# Patient Record
Sex: Female | Born: 1935 | Race: Black or African American | Hispanic: No | Marital: Married | Smoking: Former smoker
Health system: Southern US, Community
[De-identification: ages and names within clinical notes are randomized; demographics above are authoritative.]

## PROBLEM LIST (undated history)

## (undated) DIAGNOSIS — C801 Malignant (primary) neoplasm, unspecified: Secondary | ICD-10-CM

## (undated) DIAGNOSIS — I10 Essential (primary) hypertension: Secondary | ICD-10-CM

## (undated) DIAGNOSIS — E785 Hyperlipidemia, unspecified: Secondary | ICD-10-CM

## (undated) HISTORY — DX: Essential (primary) hypertension: I10

## (undated) HISTORY — DX: Malignant (primary) neoplasm, unspecified: C80.1

## (undated) HISTORY — DX: Hyperlipidemia, unspecified: E78.5

## (undated) HISTORY — PX: BREAST LUMPECTOMY: SHX2

---

## 1998-01-29 ENCOUNTER — Ambulatory Visit (HOSPITAL_COMMUNITY): Admission: RE | Admit: 1998-01-29 | Discharge: 1998-01-29 | Payer: Self-pay | Admitting: *Deleted

## 1998-08-26 ENCOUNTER — Encounter: Admission: RE | Admit: 1998-08-26 | Discharge: 1998-11-24 | Payer: Self-pay | Admitting: Unknown Physician Specialty

## 1998-09-15 ENCOUNTER — Emergency Department (HOSPITAL_COMMUNITY): Admission: EM | Admit: 1998-09-15 | Discharge: 1998-09-15 | Payer: Self-pay | Admitting: Emergency Medicine

## 1998-09-15 ENCOUNTER — Encounter: Payer: Self-pay | Admitting: Emergency Medicine

## 2003-12-31 ENCOUNTER — Encounter: Admission: RE | Admit: 2003-12-31 | Discharge: 2003-12-31 | Payer: Self-pay | Admitting: General Surgery

## 2003-12-31 ENCOUNTER — Encounter (INDEPENDENT_AMBULATORY_CARE_PROVIDER_SITE_OTHER): Payer: Self-pay | Admitting: Specialist

## 2004-01-18 ENCOUNTER — Encounter (HOSPITAL_COMMUNITY): Admission: RE | Admit: 2004-01-18 | Discharge: 2004-04-17 | Payer: Self-pay | Admitting: General Surgery

## 2004-02-04 ENCOUNTER — Encounter (INDEPENDENT_AMBULATORY_CARE_PROVIDER_SITE_OTHER): Payer: Self-pay | Admitting: *Deleted

## 2004-02-04 ENCOUNTER — Ambulatory Visit (HOSPITAL_COMMUNITY): Admission: RE | Admit: 2004-02-04 | Discharge: 2004-02-04 | Payer: Self-pay | Admitting: General Surgery

## 2004-02-04 ENCOUNTER — Ambulatory Visit (HOSPITAL_BASED_OUTPATIENT_CLINIC_OR_DEPARTMENT_OTHER): Admission: RE | Admit: 2004-02-04 | Discharge: 2004-02-04 | Payer: Self-pay | Admitting: General Surgery

## 2004-02-04 ENCOUNTER — Encounter: Admission: RE | Admit: 2004-02-04 | Discharge: 2004-02-04 | Payer: Self-pay | Admitting: General Surgery

## 2004-03-18 ENCOUNTER — Ambulatory Visit: Admission: RE | Admit: 2004-03-18 | Discharge: 2004-05-22 | Payer: Self-pay | Admitting: Radiation Oncology

## 2004-06-17 ENCOUNTER — Ambulatory Visit: Admission: RE | Admit: 2004-06-17 | Discharge: 2004-06-17 | Payer: Self-pay | Admitting: Radiation Oncology

## 2004-08-22 ENCOUNTER — Ambulatory Visit: Payer: Self-pay | Admitting: Oncology

## 2004-09-12 ENCOUNTER — Encounter: Admission: RE | Admit: 2004-09-12 | Discharge: 2004-09-12 | Payer: Self-pay | Admitting: Oncology

## 2004-10-29 ENCOUNTER — Ambulatory Visit: Payer: Self-pay | Admitting: Oncology

## 2004-11-19 ENCOUNTER — Ambulatory Visit: Admission: RE | Admit: 2004-11-19 | Discharge: 2004-11-19 | Payer: Self-pay | Admitting: Radiation Oncology

## 2005-04-29 ENCOUNTER — Ambulatory Visit: Payer: Self-pay | Admitting: Oncology

## 2005-09-22 ENCOUNTER — Encounter (INDEPENDENT_AMBULATORY_CARE_PROVIDER_SITE_OTHER): Payer: Self-pay | Admitting: Specialist

## 2005-09-22 ENCOUNTER — Encounter: Admission: RE | Admit: 2005-09-22 | Discharge: 2005-09-22 | Payer: Self-pay | Admitting: Oncology

## 2005-10-28 ENCOUNTER — Ambulatory Visit: Payer: Self-pay | Admitting: Oncology

## 2006-03-23 ENCOUNTER — Encounter: Admission: RE | Admit: 2006-03-23 | Discharge: 2006-03-23 | Payer: Self-pay | Admitting: Oncology

## 2006-04-20 ENCOUNTER — Ambulatory Visit: Payer: Self-pay | Admitting: Oncology

## 2006-04-22 LAB — CBC WITH DIFFERENTIAL/PLATELET
BASO%: 0.8 % (ref 0.0–2.0)
Eosinophils Absolute: 0.1 10*3/uL (ref 0.0–0.5)
LYMPH%: 25.6 % (ref 14.0–48.0)
MCHC: 34 g/dL (ref 32.0–36.0)
MONO#: 0.7 10*3/uL (ref 0.1–0.9)
NEUT#: 3.9 10*3/uL (ref 1.5–6.5)
Platelets: 271 10*3/uL (ref 145–400)
RBC: 4.52 10*6/uL (ref 3.70–5.32)
RDW: 13.8 % (ref 11.3–14.5)
WBC: 6.4 10*3/uL (ref 3.9–10.0)
lymph#: 1.6 10*3/uL (ref 0.9–3.3)

## 2006-04-22 LAB — COMPREHENSIVE METABOLIC PANEL
ALT: 17 U/L (ref 0–40)
Albumin: 4.2 g/dL (ref 3.5–5.2)
CO2: 24 mEq/L (ref 19–32)
Chloride: 101 mEq/L (ref 96–112)
Glucose, Bld: 179 mg/dL — ABNORMAL HIGH (ref 70–99)
Potassium: 3.8 mEq/L (ref 3.5–5.3)
Sodium: 138 mEq/L (ref 135–145)
Total Bilirubin: 0.6 mg/dL (ref 0.3–1.2)
Total Protein: 6.8 g/dL (ref 6.0–8.3)

## 2006-04-22 LAB — LACTATE DEHYDROGENASE: LDH: 119 U/L (ref 94–250)

## 2006-09-24 ENCOUNTER — Encounter: Admission: RE | Admit: 2006-09-24 | Discharge: 2006-09-24 | Payer: Self-pay | Admitting: Oncology

## 2006-10-19 ENCOUNTER — Ambulatory Visit: Payer: Self-pay | Admitting: Oncology

## 2006-10-21 LAB — CBC WITH DIFFERENTIAL/PLATELET
BASO%: 0.6 % (ref 0.0–2.0)
LYMPH%: 18.7 % (ref 14.0–48.0)
MCHC: 34.1 g/dL (ref 32.0–36.0)
MONO#: 0.6 10*3/uL (ref 0.1–0.9)
Platelets: 295 10*3/uL (ref 145–400)
RBC: 4.88 10*6/uL (ref 3.70–5.32)
RDW: 13.2 % (ref 11.3–14.5)
WBC: 6.6 10*3/uL (ref 3.9–10.0)

## 2006-10-21 LAB — LACTATE DEHYDROGENASE: LDH: 132 U/L (ref 94–250)

## 2006-10-21 LAB — COMPREHENSIVE METABOLIC PANEL
ALT: 9 U/L (ref 0–35)
Alkaline Phosphatase: 57 U/L (ref 39–117)
CO2: 27 mEq/L (ref 19–32)
Potassium: 4 mEq/L (ref 3.5–5.3)
Sodium: 138 mEq/L (ref 135–145)
Total Bilirubin: 0.5 mg/dL (ref 0.3–1.2)
Total Protein: 7 g/dL (ref 6.0–8.3)

## 2006-10-22 ENCOUNTER — Encounter: Admission: RE | Admit: 2006-10-22 | Discharge: 2006-10-22 | Payer: Self-pay | Admitting: Oncology

## 2007-05-03 ENCOUNTER — Ambulatory Visit: Payer: Self-pay | Admitting: Oncology

## 2007-05-05 LAB — COMPREHENSIVE METABOLIC PANEL
ALT: 13 U/L (ref 0–35)
AST: 14 U/L (ref 0–37)
Albumin: 4.5 g/dL (ref 3.5–5.2)
Alkaline Phosphatase: 75 U/L (ref 39–117)
BUN: 15 mg/dL (ref 6–23)
Calcium: 9.9 mg/dL (ref 8.4–10.5)
Chloride: 101 mEq/L (ref 96–112)
Potassium: 4.1 mEq/L (ref 3.5–5.3)
Sodium: 138 mEq/L (ref 135–145)

## 2007-05-05 LAB — CBC WITH DIFFERENTIAL/PLATELET
BASO%: 0.6 % (ref 0.0–2.0)
EOS%: 3.4 % (ref 0.0–7.0)
HCT: 42.6 % (ref 34.8–46.6)
LYMPH%: 26.8 % (ref 14.0–48.0)
MCH: 29.2 pg (ref 26.0–34.0)
MCHC: 34 g/dL (ref 32.0–36.0)
MONO%: 11.5 % (ref 0.0–13.0)
NEUT%: 57.7 % (ref 39.6–76.8)
Platelets: 289 10*3/uL (ref 145–400)

## 2007-10-24 ENCOUNTER — Ambulatory Visit: Payer: Self-pay | Admitting: Oncology

## 2007-10-24 ENCOUNTER — Encounter: Admission: RE | Admit: 2007-10-24 | Discharge: 2007-10-24 | Payer: Self-pay | Admitting: Oncology

## 2007-10-26 LAB — CBC WITH DIFFERENTIAL/PLATELET
Basophils Absolute: 0.1 10*3/uL (ref 0.0–0.1)
EOS%: 2 % (ref 0.0–7.0)
Eosinophils Absolute: 0.1 10*3/uL (ref 0.0–0.5)
HCT: 42.2 % (ref 34.8–46.6)
HGB: 14.3 g/dL (ref 11.6–15.9)
MCH: 29.8 pg (ref 26.0–34.0)
MCV: 87.6 fL (ref 81.0–101.0)
MONO%: 10.4 % (ref 0.0–13.0)
NEUT#: 4.5 10*3/uL (ref 1.5–6.5)
NEUT%: 61.4 % (ref 39.6–76.8)
lymph#: 1.9 10*3/uL (ref 0.9–3.3)

## 2007-10-27 LAB — COMPREHENSIVE METABOLIC PANEL
AST: 14 U/L (ref 0–37)
Albumin: 4.3 g/dL (ref 3.5–5.2)
BUN: 18 mg/dL (ref 6–23)
Calcium: 9.9 mg/dL (ref 8.4–10.5)
Chloride: 100 mEq/L (ref 96–112)
Creatinine, Ser: 1.04 mg/dL (ref 0.40–1.20)
Glucose, Bld: 171 mg/dL — ABNORMAL HIGH (ref 70–99)
Potassium: 4.5 mEq/L (ref 3.5–5.3)

## 2007-11-02 LAB — VITAMIN D 1,25 DIHYDROXY: Vit D, 1,25-Dihydroxy: 34 pg/mL (ref 6–62)

## 2008-04-23 ENCOUNTER — Ambulatory Visit: Payer: Self-pay | Admitting: Oncology

## 2008-06-05 LAB — CBC WITH DIFFERENTIAL/PLATELET
BASO%: 0.6 % (ref 0.0–2.0)
LYMPH%: 29.8 % (ref 14.0–48.0)
MCHC: 33.2 g/dL (ref 32.0–36.0)
MCV: 90.4 fL (ref 81.0–101.0)
MONO#: 0.6 10*3/uL (ref 0.1–0.9)
MONO%: 9.7 % (ref 0.0–13.0)
Platelets: 250 10*3/uL (ref 145–400)
RBC: 4.6 10*6/uL (ref 3.70–5.32)
RDW: 13 % (ref 11.3–14.5)
WBC: 6.7 10*3/uL (ref 3.9–10.0)

## 2008-06-06 LAB — COMPREHENSIVE METABOLIC PANEL
ALT: 13 U/L (ref 0–35)
AST: 16 U/L (ref 0–37)
Creatinine, Ser: 0.89 mg/dL (ref 0.40–1.20)
Sodium: 138 mEq/L (ref 135–145)
Total Bilirubin: 0.6 mg/dL (ref 0.3–1.2)

## 2008-06-06 LAB — CANCER ANTIGEN 27.29: CA 27.29: 31 U/mL (ref 0–39)

## 2008-10-24 ENCOUNTER — Encounter: Admission: RE | Admit: 2008-10-24 | Discharge: 2008-10-24 | Payer: Self-pay | Admitting: Oncology

## 2008-11-15 ENCOUNTER — Ambulatory Visit: Payer: Self-pay | Admitting: Oncology

## 2008-11-19 LAB — CBC WITH DIFFERENTIAL/PLATELET
Basophils Absolute: 0.1 10*3/uL (ref 0.0–0.1)
EOS%: 1.7 % (ref 0.0–7.0)
Eosinophils Absolute: 0.1 10*3/uL (ref 0.0–0.5)
HGB: 13.9 g/dL (ref 11.6–15.9)
LYMPH%: 20.4 % (ref 14.0–49.7)
MCH: 29.9 pg (ref 25.1–34.0)
MCV: 88.8 fL (ref 79.5–101.0)
MONO%: 8.8 % (ref 0.0–14.0)
Platelets: 256 10*3/uL (ref 145–400)
RDW: 13.9 % (ref 11.2–14.5)

## 2008-11-20 LAB — COMPREHENSIVE METABOLIC PANEL
AST: 13 U/L (ref 0–37)
Albumin: 4.2 g/dL (ref 3.5–5.2)
Alkaline Phosphatase: 60 U/L (ref 39–117)
BUN: 17 mg/dL (ref 6–23)
Creatinine, Ser: 0.89 mg/dL (ref 0.40–1.20)
Glucose, Bld: 232 mg/dL — ABNORMAL HIGH (ref 70–99)
Potassium: 4.2 mEq/L (ref 3.5–5.3)
Total Bilirubin: 0.4 mg/dL (ref 0.3–1.2)

## 2008-11-20 LAB — VITAMIN D 25 HYDROXY (VIT D DEFICIENCY, FRACTURES): Vit D, 25-Hydroxy: 73 ng/mL (ref 30–89)

## 2009-05-17 ENCOUNTER — Ambulatory Visit: Payer: Self-pay | Admitting: Oncology

## 2009-05-21 LAB — CBC WITH DIFFERENTIAL/PLATELET
BASO%: 0.9 % (ref 0.0–2.0)
Basophils Absolute: 0.1 10*3/uL (ref 0.0–0.1)
EOS%: 2.9 % (ref 0.0–7.0)
LYMPH%: 26.2 % (ref 14.0–49.7)
MONO#: 0.5 10*3/uL (ref 0.1–0.9)
NEUT%: 62.5 % (ref 38.4–76.8)
RBC: 4.96 10*6/uL (ref 3.70–5.45)
RDW: 13.3 % (ref 11.2–14.5)
lymph#: 1.8 10*3/uL (ref 0.9–3.3)
nRBC: 0 % (ref 0–0)

## 2009-05-22 LAB — CANCER ANTIGEN 27.29: CA 27.29: 32 U/mL (ref 0–39)

## 2009-05-22 LAB — COMPREHENSIVE METABOLIC PANEL
ALT: 17 U/L (ref 0–35)
BUN: 17 mg/dL (ref 6–23)
Chloride: 101 mEq/L (ref 96–112)
Creatinine, Ser: 0.97 mg/dL (ref 0.40–1.20)
Sodium: 136 mEq/L (ref 135–145)

## 2009-10-25 ENCOUNTER — Encounter: Admission: RE | Admit: 2009-10-25 | Discharge: 2009-10-25 | Payer: Self-pay | Admitting: Oncology

## 2010-05-08 ENCOUNTER — Ambulatory Visit: Payer: Self-pay | Admitting: Oncology

## 2010-05-12 LAB — CBC WITH DIFFERENTIAL/PLATELET
EOS%: 2.7 % (ref 0.0–7.0)
Eosinophils Absolute: 0.2 10*3/uL (ref 0.0–0.5)
HCT: 42.3 % (ref 34.8–46.6)
HGB: 14.4 g/dL (ref 11.6–15.9)
MCH: 30.4 pg (ref 25.1–34.0)
MCV: 89.2 fL (ref 79.5–101.0)
RBC: 4.74 10*6/uL (ref 3.70–5.45)
RDW: 13.7 % (ref 11.2–14.5)
WBC: 7.7 10*3/uL (ref 3.9–10.3)

## 2010-05-13 LAB — COMPREHENSIVE METABOLIC PANEL
ALT: 13 U/L (ref 0–35)
Albumin: 4.6 g/dL (ref 3.5–5.2)
CO2: 27 mEq/L (ref 19–32)
Chloride: 101 mEq/L (ref 96–112)
Creatinine, Ser: 1.06 mg/dL (ref 0.40–1.20)
Sodium: 140 mEq/L (ref 135–145)
Total Protein: 7.3 g/dL (ref 6.0–8.3)

## 2010-05-13 LAB — CANCER ANTIGEN 27.29: CA 27.29: 28 U/mL (ref 0–39)

## 2010-05-13 LAB — LACTATE DEHYDROGENASE: LDH: 126 U/L (ref 94–250)

## 2010-08-30 ENCOUNTER — Other Ambulatory Visit: Payer: Self-pay | Admitting: Oncology

## 2010-08-30 DIAGNOSIS — N63 Unspecified lump in unspecified breast: Secondary | ICD-10-CM

## 2010-08-30 DIAGNOSIS — Z780 Asymptomatic menopausal state: Secondary | ICD-10-CM

## 2010-10-27 ENCOUNTER — Ambulatory Visit
Admission: RE | Admit: 2010-10-27 | Discharge: 2010-10-27 | Disposition: A | Discharge status: Home / Self Care | Payer: Medicare Other | Source: Ambulatory Visit | Attending: Oncology | Admitting: Oncology

## 2010-10-27 ENCOUNTER — Ambulatory Visit
Admission: RE | Admit: 2010-10-27 | Discharge: 2010-10-27 | Disposition: A | Discharge status: Home / Self Care | Payer: PRIVATE HEALTH INSURANCE | Source: Ambulatory Visit | Attending: Oncology | Admitting: Oncology

## 2010-10-27 DIAGNOSIS — N63 Unspecified lump in unspecified breast: Secondary | ICD-10-CM

## 2010-10-27 DIAGNOSIS — Z780 Asymptomatic menopausal state: Secondary | ICD-10-CM

## 2010-11-27 ENCOUNTER — Other Ambulatory Visit: Payer: Self-pay | Admitting: Dermatology

## 2010-12-26 NOTE — Op Note (Signed)
Aimee Kim, Aimee Kim                           ACCOUNT NO.:  [id number]   MEDICAL RECORD NO.:  [id number]                   PATIENT TYPE:  AMB   LOCATION:  DSC                                  FACILITY:  [facility]   PHYSICIAN:  Gabrielle Dare. Janee Morn, M.D.             DATE OF BIRTH:  [birth year]   DATE OF PROCEDURE:  02/04/2004  DATE OF DISCHARGE:                                 OPERATIVE REPORT   PREOPERATIVE DIAGNOSIS:  Ductal carcinoma in situ, right breast.   POSTOPERATIVE DIAGNOSIS:  Ductal carcinoma in situ, right breast.   OPERATION PERFORMED:  Right partial mastectomy with needle localization.   SURGEON:  Gabrielle Dare. Janee Morn, M.D.   ASSISTANT:  Rose Phi. Maple Hudson, M.D.   ANESTHESIA:  MAC.   INDICATIONS FOR PROCEDURE:  The patient is a 75 year old African-American  female whom I evaluated in the office for suspicious mammogram findings in  the upper outer quadrant of her right breast.  She was sent for biopsy at  the breast center which demonstrated DCIS and she presents today for needle  localization biopsy of right breast.   DESCRIPTION OF PROCEDURE:  The patient had insertion of localization needles  at the breast center and their report requests tissue encompassing both the  needles and down to the patient's nipple.  The films were reviewed.  The  patient was brought to the operating room after informed consent was  obtained.  She received intravenous antibiotics.  MAC anesthesia was  administered.  Her right breast was prepped and draped in sterile fashion.  A curvilinear several centimeter incision was made between the two needles.  Subcutaneous tissues were dissected down.  A flap was raised superiorly and  we brought this back until we encountered the needle which was brought into  the specimen on the far aspect of that.  We then continued our dissection of  the mass down to the chest wall and continued the dissection caudally.  Subsequently, we created a flap along the lower  edge of the incision until  we reached the needle which was brought into the wound.  Then we continued  our dissection to make the specimen encompass tissue all the way up to the  nipple as requested and then we dissected down to the chest wall and the  mass was taken off of the chest wall.  This was all done with Bovie cautery  with good hemostasis obtained along the way.  The mass was marked for  orientation with the margin marking system and sent for confirmatory  mammogram.  The wound was copiously irrigated.  Meticulous hemostasis was  obtained.  Subcutaneous tissues were reapproximated with a series of  interrupted 3-0 Vicryl sutures and the skin was closed with a running 4-0  Monocryl subcuticular stitch.  Sponge, needle and instrument counts were  correct.  Benzoin, Steri-Strips and sterile dressings were applied.  We did  a report that  the specimen was included from radiology, so all things looked  good.  The patient tolerated the procedure well without apparent  complication and was taken to the recovery room in stable condition.                                               Gabrielle Dare Janee Morn, M.D.   BET/MEDQ  D:  02/04/2004  T:  02/04/2004  Job:  [id number]

## 2011-05-21 ENCOUNTER — Other Ambulatory Visit: Payer: Self-pay | Admitting: Oncology

## 2011-05-21 ENCOUNTER — Encounter (HOSPITAL_BASED_OUTPATIENT_CLINIC_OR_DEPARTMENT_OTHER): Payer: Medicare Other | Admitting: Oncology

## 2011-05-21 DIAGNOSIS — C50419 Malignant neoplasm of upper-outer quadrant of unspecified female breast: Secondary | ICD-10-CM

## 2011-05-21 DIAGNOSIS — E119 Type 2 diabetes mellitus without complications: Secondary | ICD-10-CM

## 2011-05-21 DIAGNOSIS — I10 Essential (primary) hypertension: Secondary | ICD-10-CM

## 2011-05-21 DIAGNOSIS — E7800 Pure hypercholesterolemia, unspecified: Secondary | ICD-10-CM

## 2011-05-21 LAB — CBC WITH DIFFERENTIAL/PLATELET
Basophils Absolute: 0 10*3/uL (ref 0.0–0.1)
EOS%: 5.3 % (ref 0.0–7.0)
Eosinophils Absolute: 0.4 10*3/uL (ref 0.0–0.5)
LYMPH%: 22.8 % (ref 14.0–49.7)
MCH: 29.5 pg (ref 25.1–34.0)
MCV: 89.1 fL (ref 79.5–101.0)
MONO%: 8 % (ref 0.0–14.0)
NEUT#: 4.9 10*3/uL (ref 1.5–6.5)
Platelets: 253 10*3/uL (ref 145–400)
RBC: 4.88 10*6/uL (ref 3.70–5.45)

## 2011-05-22 LAB — COMPREHENSIVE METABOLIC PANEL
AST: 14 U/L (ref 0–37)
Alkaline Phosphatase: 55 U/L (ref 39–117)
BUN: 24 mg/dL — ABNORMAL HIGH (ref 6–23)
Glucose, Bld: 155 mg/dL — ABNORMAL HIGH (ref 70–99)
Total Bilirubin: 0.4 mg/dL (ref 0.3–1.2)

## 2011-05-28 ENCOUNTER — Other Ambulatory Visit: Payer: Self-pay | Admitting: Oncology

## 2011-05-28 ENCOUNTER — Encounter (HOSPITAL_BASED_OUTPATIENT_CLINIC_OR_DEPARTMENT_OTHER): Payer: Medicare Other | Admitting: Oncology

## 2011-05-28 DIAGNOSIS — D0590 Unspecified type of carcinoma in situ of unspecified breast: Secondary | ICD-10-CM

## 2011-05-28 DIAGNOSIS — Z853 Personal history of malignant neoplasm of breast: Secondary | ICD-10-CM

## 2011-05-28 DIAGNOSIS — M810 Age-related osteoporosis without current pathological fracture: Secondary | ICD-10-CM

## 2011-06-17 ENCOUNTER — Other Ambulatory Visit: Payer: Self-pay | Admitting: Internal Medicine

## 2011-06-17 ENCOUNTER — Telehealth: Payer: Self-pay | Admitting: Oncology

## 2011-06-17 DIAGNOSIS — N63 Unspecified lump in unspecified breast: Secondary | ICD-10-CM

## 2011-06-17 NOTE — Telephone Encounter (Signed)
pls have her call the surgeon.. Thank you!!!!

## 2011-06-17 NOTE — Telephone Encounter (Signed)
Pt called and stated that she has tender area above breast.  She went for consultation with her PCP and they would like you to see her.  We are showing her as discharged but do you want to see her for the above stated problem?

## 2011-06-26 ENCOUNTER — Ambulatory Visit
Admission: RE | Admit: 2011-06-26 | Discharge: 2011-06-26 | Disposition: A | Discharge status: Home / Self Care | Payer: Medicare Other | Source: Ambulatory Visit | Attending: Internal Medicine | Admitting: Internal Medicine

## 2011-06-26 DIAGNOSIS — N63 Unspecified lump in unspecified breast: Secondary | ICD-10-CM

## 2011-10-29 ENCOUNTER — Ambulatory Visit
Admission: RE | Admit: 2011-10-29 | Discharge: 2011-10-29 | Disposition: A | Discharge status: Home / Self Care | Payer: Medicare Other | Source: Ambulatory Visit | Attending: Oncology | Admitting: Oncology

## 2011-10-29 DIAGNOSIS — Z853 Personal history of malignant neoplasm of breast: Secondary | ICD-10-CM

## 2011-11-02 ENCOUNTER — Encounter (INDEPENDENT_AMBULATORY_CARE_PROVIDER_SITE_OTHER): Payer: Self-pay

## 2011-11-02 DIAGNOSIS — R222 Localized swelling, mass and lump, trunk: Secondary | ICD-10-CM

## 2011-11-04 ENCOUNTER — Ambulatory Visit (INDEPENDENT_AMBULATORY_CARE_PROVIDER_SITE_OTHER): Payer: Medicare Other | Admitting: General Surgery

## 2011-11-04 ENCOUNTER — Other Ambulatory Visit (INDEPENDENT_AMBULATORY_CARE_PROVIDER_SITE_OTHER): Payer: Self-pay | Admitting: General Surgery

## 2011-11-04 ENCOUNTER — Encounter (INDEPENDENT_AMBULATORY_CARE_PROVIDER_SITE_OTHER): Payer: Self-pay | Admitting: General Surgery

## 2011-11-04 VITALS — BP 152/88 | HR 84 | Temp 97.2°F | Resp 16 | Ht 65.0 in | Wt 169.2 lb

## 2011-11-04 DIAGNOSIS — R222 Localized swelling, mass and lump, trunk: Secondary | ICD-10-CM

## 2011-11-04 DIAGNOSIS — D0590 Unspecified type of carcinoma in situ of unspecified breast: Secondary | ICD-10-CM

## 2011-11-04 DIAGNOSIS — D0510 Intraductal carcinoma in situ of unspecified breast: Secondary | ICD-10-CM

## 2011-11-04 NOTE — Progress Notes (Signed)
Subjective:     Patient ID: Aimee Kim, female   DOB: 22-Jun-1936, 76 y.o.   MRN: [id number]  HPI  Patient is known to me status post lumpectomy in June of 2005 for DCIS. Last November, she noticed a mass on her right chest. She feels it is within her musculature. She gets burning pain when she does exercises or moves her right arm. At times, she has difficulty finding this mass. There been no skin changes or drainage. Review of Systems     Objective:   Physical Exam  Constitutional: She appears well-developed and well-nourished.  Neck: Normal range of motion. Neck supple.  Cardiovascular: Normal rate, normal heart sounds and intact distal pulses.   Pulmonary/Chest: Effort normal and breath sounds normal. No respiratory distress. She has no wheezes. She has no rales.  Abdominal: Soft. She exhibits no distension. There is no tenderness.   Right superior pectoral region reveals a fullness about 1.5 cm in her pectoral muscle. It is mildly tender to palpation. Right breast has well-healed scar and no palpable mass or nipple discharge Left breast has no palpable mass or nipple discharge    Assessment:     Mass right chest wall possibly within the pectoral musculature    Plan:     We'll obtain CT scan of the chest to evaluate this mass further. Since it is causing pain, it may likely need to be excised. We will see what the scan shows and plan accordingly.

## 2011-11-05 ENCOUNTER — Ambulatory Visit
Admission: RE | Admit: 2011-11-05 | Discharge: 2011-11-05 | Disposition: A | Discharge status: Home / Self Care | Payer: Medicare Other | Source: Ambulatory Visit | Attending: General Surgery | Admitting: General Surgery

## 2011-11-05 ENCOUNTER — Telehealth: Payer: Self-pay | Admitting: General Surgery

## 2011-11-05 DIAGNOSIS — R222 Localized swelling, mass and lump, trunk: Secondary | ICD-10-CM

## 2011-11-05 MED ORDER — IOHEXOL 300 MG/ML  SOLN
75.0000 mL | Freq: Once | INTRAMUSCULAR | Status: AC | PRN
  Administered 2011-11-05: 75 mL via INTRAVENOUS

## 2011-11-05 NOTE — Telephone Encounter (Signed)
I called patient and told her the results of her chest CT which showed no mass in her right pectoral musculature corresponding to the area of pain.  No surgery needed at this time.

## 2012-05-06 IMAGING — CT CT CHEST W/ CM
2 of 4 series · 15 of 36 positions shown, 18 images · IV contrast (75CC OMNI 300)
Comparison: None.

CLINICAL DATA: Right upper chest wall mass felt by patient.  Pain
when turning to left and burping.  Nonsmoker for 35 years.  Breast
cancer in 9222 with lumpectomy and radiation therapy.  Evaluate
chest wall musculature.  Diabetes.  Hypertension.

CT CHEST WITH CONTRAST
TECHNIQUE: Multidetector CT imaging of the chest was performed
following the standard protocol during bolus administration of
intravenous contrast.
Contrast: 75 ml [id number]-[id number]
BUN and creatinine were obtained on site at [HOSPITAL] at
[HOSPITAL]..
Results:  BUN 15 mg/dL,  Creatinine 0.8 mg/dL.

[Series 2: chest with · axial · 0.70mm/px · z∈[-247,+13]mm · 12 of 62 slices shown, 15 images]
[im 5/62  mediastinal]
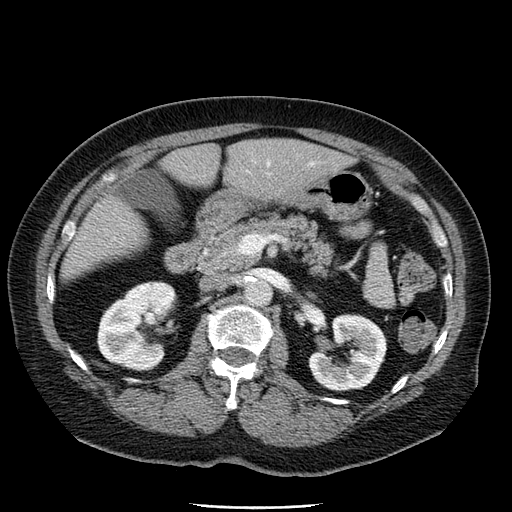
[im 5/62  lung]
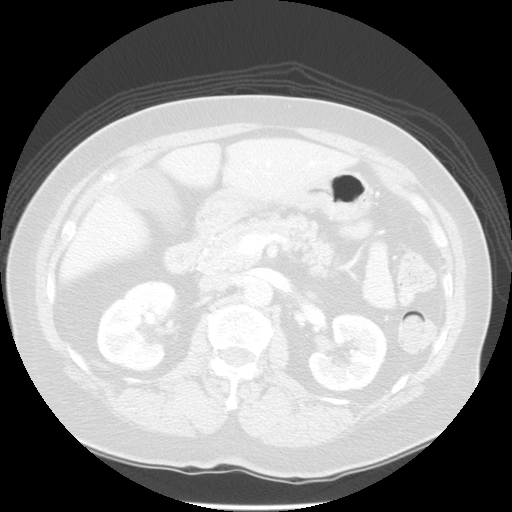
[im 9/62  lung]
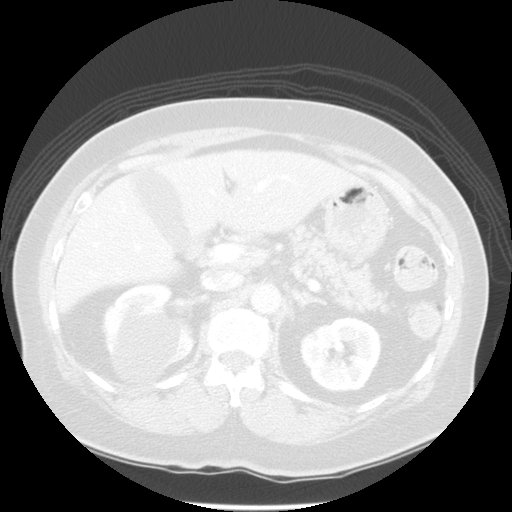
[im 14/62  lung]
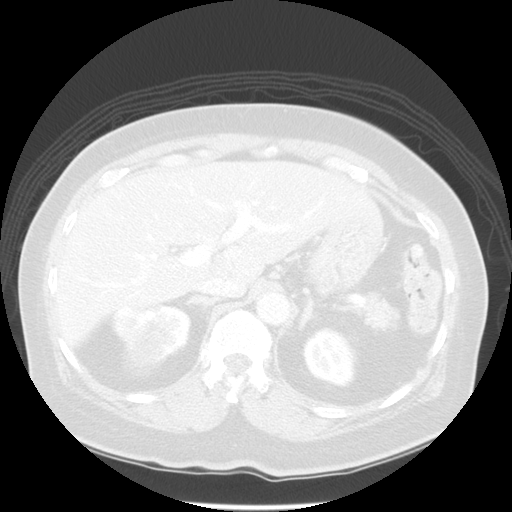
[im 18/62  lung]
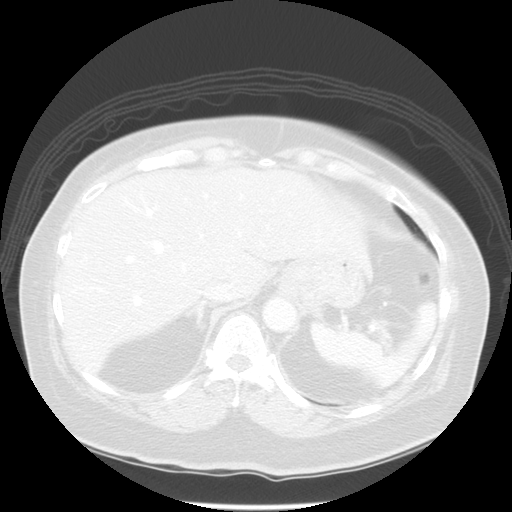
[im 22/62  mediastinal]
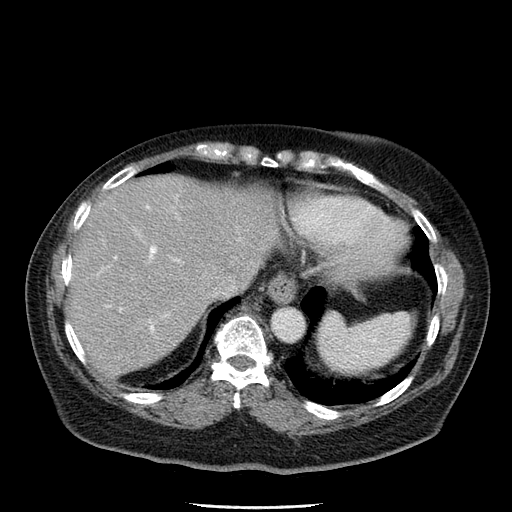
[im 22/62  lung]
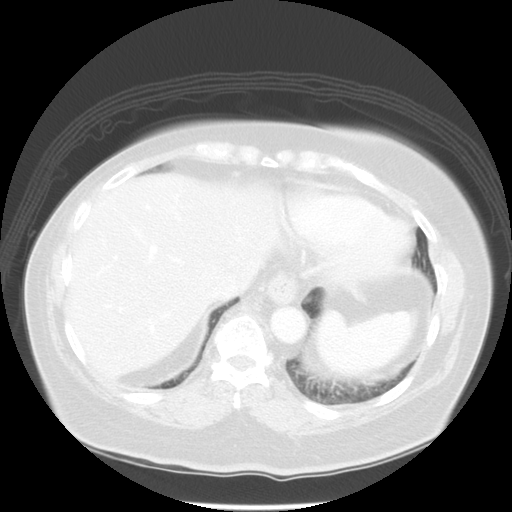
[im 27/62  lung]
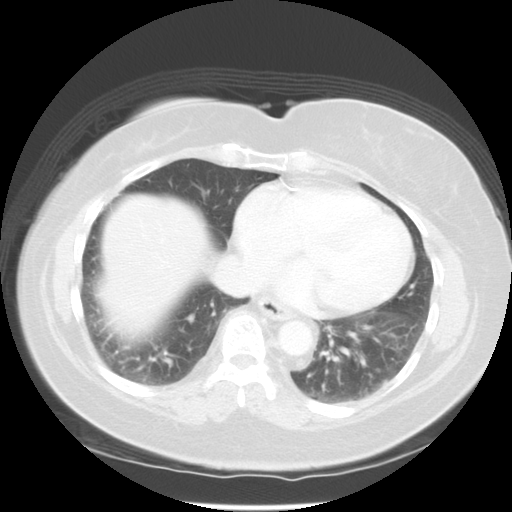
[im 35/62  lung]
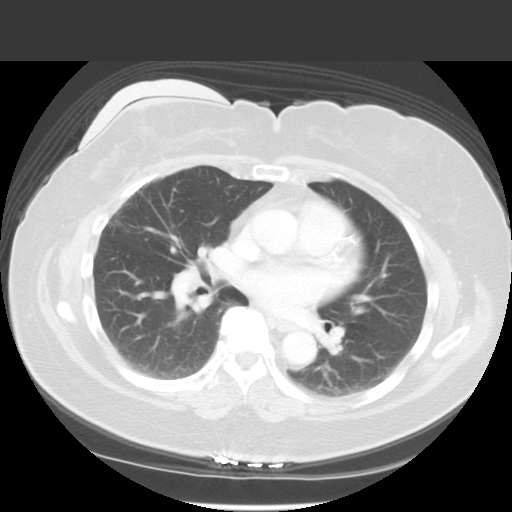
[im 40/62  lung]
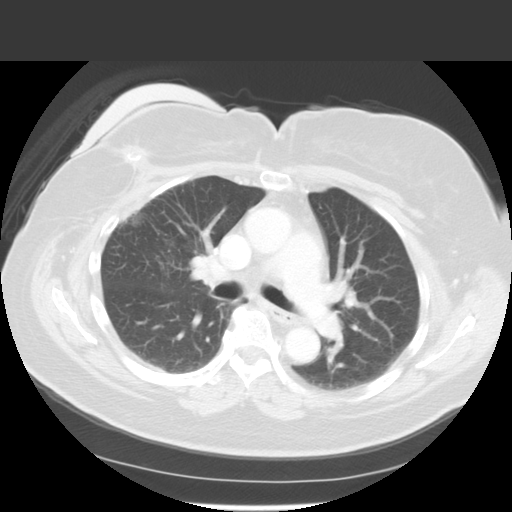
[im 44/62  mediastinal]
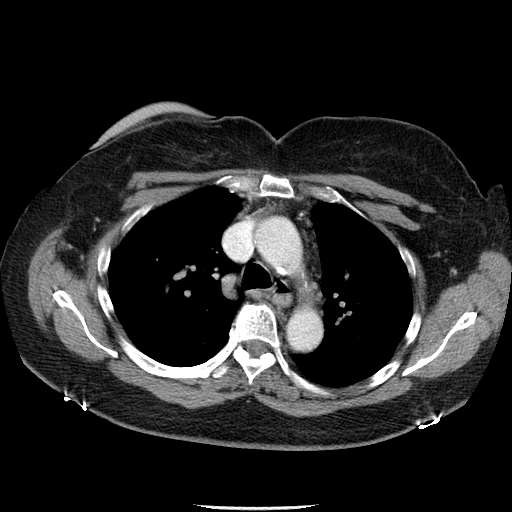
[im 44/62  lung]
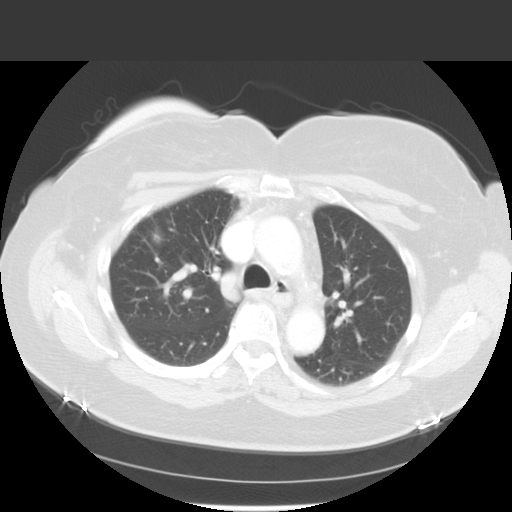
[im 48/62  lung]
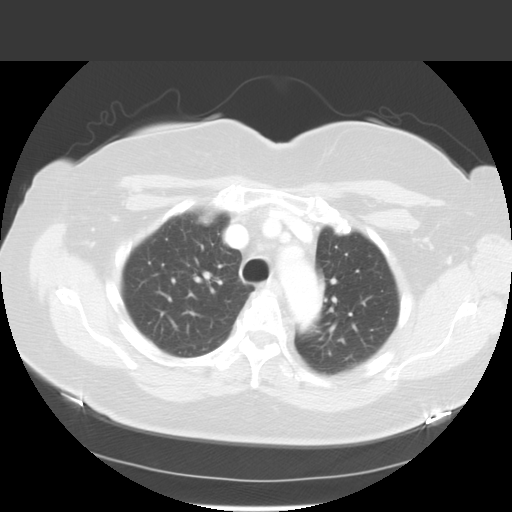
[im 53/62  lung]
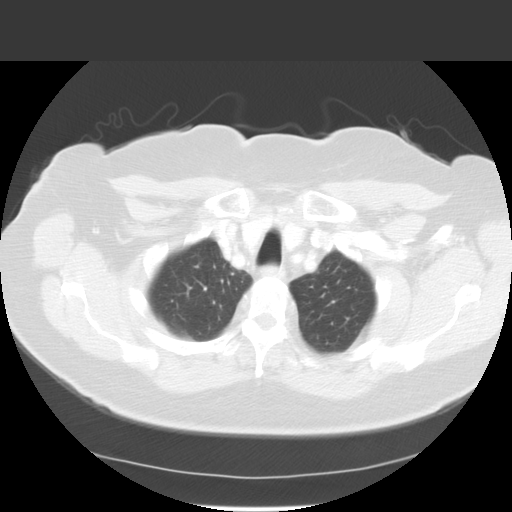
[im 57/62  lung]
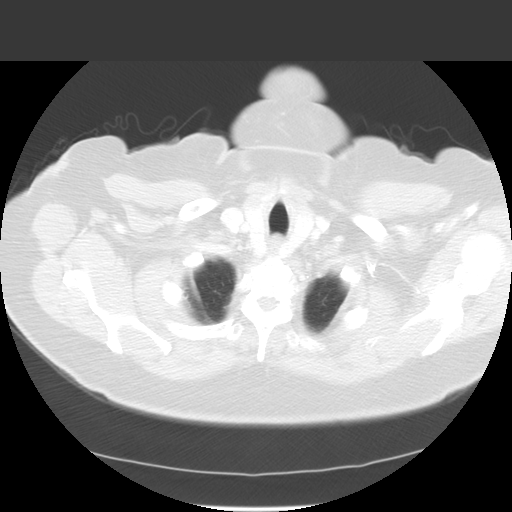

[Series 400: coronal · coronal · 0.70mm/px · 3 of 116 slices shown]
[im 24/116  lung]
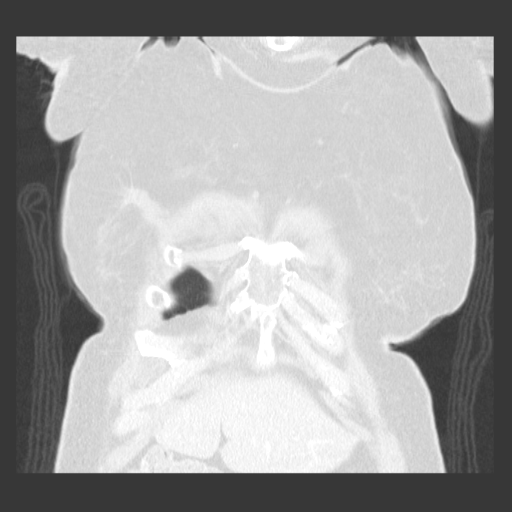
[im 47/116  lung]
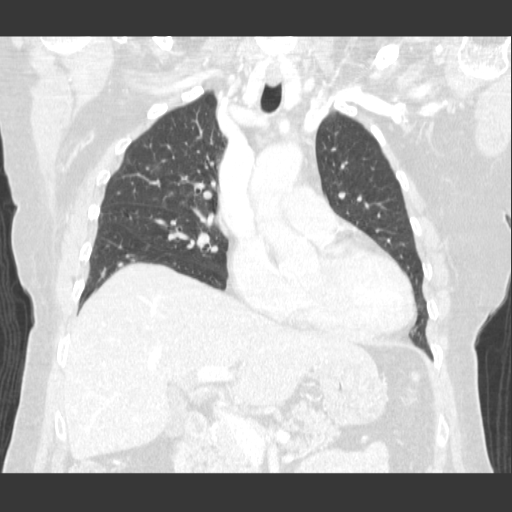
[im 70/116  lung]
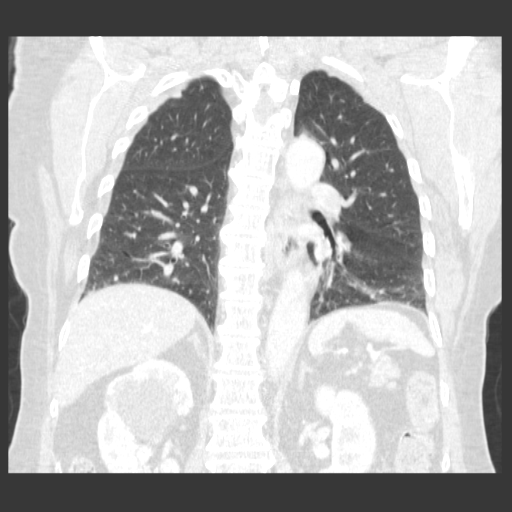

[15 of 36 positions shown; findings below may reference images not displayed]

FINDINGS: Lung windows demonstrate 3 mm right apical lung nodule on
image 8.
Patchy areas of interstitial opacity within the anterior right
upper lobe, including on images 19 and 23.

Possible right lung base 5 mm nodule on image 36.
2 mm left lower lobe nodule on image 35.

Soft tissue windows demonstrate no supraclavicular adenopathy.  The
marked area on the patient's pain is within the right
supraclavicular fossa, image 4.  There is no evidence of underlying
soft tissue mass.  May be minimal skin thickening on image 5 (this
could be due to mass effect from the adjacent skin marker).  No
underlying muscular or osseous mass.  Symmetric pectoral
musculature.  There is likely is mild skin thickening about the
right axilla on image 7.  No axillary adenopathy.  Lumpectomy
changes within the right breast.

Mild cardiomegaly, without pericardial or pleural effusion.
Multivessel coronary artery atherosclerosis.  No central pulmonary
embolism, on this non-dedicated study.  No mediastinal or hilar
adenopathy.  Tiny hiatal hernia.  Esophageal air fluid level, image
18. No internal mammary adenopathy.

Limited abdominal imaging demonstrates normal adrenal glands.
Upper pole right renal cyst measures 5.4 cm.  Probable hemangioma
within the right side of L1 on image 56 at approximately 6 mm.
IMPRESSION: 1.  No CT correlate for the palpable abnormality.  No underlying
soft tissue or osseous mass.  There may be minimal skin thickening
in this area.
2. Tiny nonspecific pulmonary nodules.  If there is a clinical
concern of pulmonary metastasis, these could be reevaluated with
follow-up chest CT.
3. Esophageal air fluid level suggests dysmotility or
gastroesophageal reflux.
4.  Minimal right upper lobe opacity, favored to be radiation
induced.

## 2012-06-09 ENCOUNTER — Telehealth: Payer: Self-pay | Admitting: Oncology

## 2012-06-09 NOTE — Telephone Encounter (Signed)
Patient returned my call today after I left a message yesterday.  She states she is doing good. She has not been in the hospital in the last year.  She has not had any fractures or broken bones in the last year. She takes Fosamax. She has not had a bone density test nor is she on any vaginal hormonal therapy.  She had her mammogram and sees her primary care yearly.

## 2012-09-26 ENCOUNTER — Other Ambulatory Visit: Payer: Self-pay | Admitting: Family Medicine

## 2012-09-26 DIAGNOSIS — Z853 Personal history of malignant neoplasm of breast: Secondary | ICD-10-CM

## 2012-09-26 DIAGNOSIS — Z1231 Encounter for screening mammogram for malignant neoplasm of breast: Secondary | ICD-10-CM

## 2012-10-20 ENCOUNTER — Other Ambulatory Visit: Payer: Self-pay | Admitting: Family Medicine

## 2012-10-20 DIAGNOSIS — M810 Age-related osteoporosis without current pathological fracture: Secondary | ICD-10-CM

## 2012-10-31 ENCOUNTER — Ambulatory Visit
Admission: RE | Admit: 2012-10-31 | Discharge: 2012-10-31 | Disposition: A | Discharge status: Home / Self Care | Payer: Medicare Other | Source: Ambulatory Visit | Attending: Family Medicine | Admitting: Family Medicine

## 2012-10-31 DIAGNOSIS — Z1231 Encounter for screening mammogram for malignant neoplasm of breast: Secondary | ICD-10-CM

## 2012-10-31 DIAGNOSIS — Z853 Personal history of malignant neoplasm of breast: Secondary | ICD-10-CM

## 2012-11-02 ENCOUNTER — Other Ambulatory Visit: Payer: Self-pay | Admitting: Family Medicine

## 2012-11-02 DIAGNOSIS — R928 Other abnormal and inconclusive findings on diagnostic imaging of breast: Secondary | ICD-10-CM

## 2012-11-04 ENCOUNTER — Ambulatory Visit
Admission: RE | Admit: 2012-11-04 | Discharge: 2012-11-04 | Disposition: A | Discharge status: Home / Self Care | Payer: Medicare Other | Source: Ambulatory Visit | Attending: Family Medicine | Admitting: Family Medicine

## 2012-11-04 DIAGNOSIS — M810 Age-related osteoporosis without current pathological fracture: Secondary | ICD-10-CM

## 2012-11-30 ENCOUNTER — Ambulatory Visit
Admission: RE | Admit: 2012-11-30 | Discharge: 2012-11-30 | Disposition: A | Discharge status: Home / Self Care | Payer: Medicare Other | Source: Ambulatory Visit | Attending: Family Medicine | Admitting: Family Medicine

## 2012-11-30 DIAGNOSIS — R928 Other abnormal and inconclusive findings on diagnostic imaging of breast: Secondary | ICD-10-CM

## 2013-02-09 ENCOUNTER — Encounter: Payer: Medicare Other | Attending: Family Medicine | Admitting: *Deleted

## 2013-02-09 ENCOUNTER — Encounter: Payer: Self-pay | Admitting: *Deleted

## 2013-02-09 VITALS — Ht 65.0 in | Wt 168.7 lb

## 2013-02-09 DIAGNOSIS — E119 Type 2 diabetes mellitus without complications: Secondary | ICD-10-CM | POA: Insufficient documentation

## 2013-02-09 DIAGNOSIS — Z713 Dietary counseling and surveillance: Secondary | ICD-10-CM | POA: Insufficient documentation

## 2013-02-09 NOTE — Progress Notes (Signed)
Patient was seen on 02/09/2013 for the first of a series of three diabetes self-management courses at the [facility]. Patient's most recent A1c was 7.7 % on 01/30/2013 The following learning objectives were met by the patient during this course:   Defines the role of glucose and insulin  Identifies type of diabetes and pathophysiology  Defines the diagnostic criteria for diabetes and prediabetes  States the risk factors for Type 2 Diabetes  States the symptoms of Type 2 Diabetes  Defines Type 2 Diabetes treatment goals  Defines Type 2 Diabetes treatment options  States the rationale for glucose monitoring  Identifies A1C, glucose targets, and testing times  Identifies proper sharps disposal  Defines the purpose of a diabetes food plan  Identifies carbohydrate food groups  Defines effects of carbohydrate foods on glucose levels  Identifies carbohydrate choices/grams/food labels  States benefits of physical activity and effect on glucose  Review of suggested activity guidelines  Handouts given during class include:  Type 2 Diabetes: Basics Book  My Food Plan Book  Food and Activity Log   Follow-Up Plan: Core Class 2

## 2013-02-09 NOTE — Patient Instructions (Addendum)
Goals:  Follow Diabetes Meal Plan as instructed  Eat 3 meals and 2 snacks, every 3-5 hrs  Limit carbohydrate intake to 30-45 grams carbohydrate/meal  Limit carbohydrate intake to 15 grams carbohydrate/snack  Add lean protein foods to meals/snacks  Monitor glucose levels as instructed by your doctor  Aim for 30 mins of physical activity daily  Bring food record and glucose log to your next nutrition visit 

## 2013-03-14 ENCOUNTER — Encounter: Payer: Medicare Other | Attending: Family Medicine

## 2013-03-14 DIAGNOSIS — E119 Type 2 diabetes mellitus without complications: Secondary | ICD-10-CM | POA: Insufficient documentation

## 2013-03-14 DIAGNOSIS — Z713 Dietary counseling and surveillance: Secondary | ICD-10-CM | POA: Insufficient documentation

## 2013-03-15 NOTE — Progress Notes (Signed)
Patient was seen on 03/14/13 for the second of a series of three diabetes self-management courses at the [facility]. The following learning objectives were met by the patient during this course:   Explain basic nutrition maintenance and quality assurance  Describe causes, symptoms and treatment of hypoglycemia and hyperglycemia  Explain how to manage diabetes during illness  Describe the importance of good nutrition for health and healthy eating strategies  List strategies to follow meal plan when dining out  Describe the effects of alcohol on glucose and how to use it safely  Describe problem solving skills for day-to-day glucose challenges  Describe strategies to use when treatment plan needs to change  Identify important factors involved in successful weight loss  Describe ways to remain physically active  Describe the impact of regular activity on insulin resistance  Identify current diabetes medications, their action on blood glucose, and [pssible side effects.  Handouts given in class:  Refrigerator magnet for Sick Day Guidelines  [facility] Oral medication/insulin handout  Your patient has identified their diabetes self-care support plan as:  [facility] support group   Follow-Up Plan: Patient will attend the final class of the ADA Diabetes Self-Care Education.   

## 2013-03-16 DIAGNOSIS — Z713 Dietary counseling and surveillance: Secondary | ICD-10-CM | POA: Diagnosis not present

## 2013-03-16 DIAGNOSIS — E119 Type 2 diabetes mellitus without complications: Secondary | ICD-10-CM

## 2013-03-16 NOTE — Progress Notes (Signed)
Patient was seen on 03/16/13 for the third of a series of three diabetes self-management courses at the [facility]. The following learning objectives were met by the patient during this course:    Describe how diabetes changes over time   Identify diabetes complications and ways to prevent them   Describe strategies that can promote heart health including lowering blood pressure and cholesterol   Describe strategies to lower dietary fat and sodium in the diet   Identify physical activities that benefit cardiovascular health   Describe role of stress on blood glucose and develop strategies to address psychosocial issues   Evaluate success in meeting personal goal   Describe the belief that they can live successfully with diabetes day to day   Establish 2-3 goals that they will plan to diligently work on until they return for the free 39-month follow-up visit  The following handouts were given in class:  Goal setting handout  Class evaluation form  Low-sodium seasoning tips  Stress management handout  Your patient has established the following 4 month goals for diabetes self-care:  Count carbohydrates at most meals and snacks  Look for patterns in log book 3 days a month  Your patient has identified these potential barriers to change:  Forgetting  Your patient has identified their diabetes self-care support plan as:  [facility] support group   Follow-Up Plan: Patient was offered a 4 month follow-up visit for diabetes self-management education.

## 2013-07-18 ENCOUNTER — Ambulatory Visit: Payer: Medicare Other | Admitting: *Deleted

## 2013-07-20 ENCOUNTER — Encounter: Payer: Medicare Other | Attending: Family Medicine | Admitting: *Deleted

## 2013-07-20 ENCOUNTER — Encounter: Payer: Self-pay | Admitting: *Deleted

## 2013-07-20 VITALS — Ht 65.0 in | Wt 166.5 lb

## 2013-07-20 DIAGNOSIS — E119 Type 2 diabetes mellitus without complications: Secondary | ICD-10-CM

## 2013-07-20 DIAGNOSIS — Z713 Dietary counseling and surveillance: Secondary | ICD-10-CM | POA: Insufficient documentation

## 2013-07-20 NOTE — Progress Notes (Signed)
  Patient was seen on 07/20/13 for their 3 month follow-up as a part of the diabetes self-management courses at the [facility]. The following learning objectives were met by your patient during this course:  Patient self reports the following:  Diabetes control has improved since diabetes self-management training: Test glucose FBS most days. Range 83-160 over the past month. Making good food modifications. Is not measuring for portion control. Feels she may be having portions too large. Having occasional HS snack some times all carbs. Number of days blood glucose is >200: none Last MD appointment for diabetes: 07/17/12  A1c 7.2% up from 7.1% Changes in treatment plan: Decrease portion control by measuring. Have carb and protein snack every night before bed. Confidence with ability to manage diabetes: good Areas for improvement with diabetes self-care: dietary restriction Willingness to participate in diabetes support group: no Exercise: Silver sneakers 3X weekly, water aerobics  Measure carbohydrates  Snack before bed of one carbohydrate and one protein choice to see if the morning glucose will decrease.  Be sure to count starchy vegetables at carbohydrates (corn, peas, beans) Return as needed for follow up

## 2013-07-20 NOTE — Patient Instructions (Signed)
Measure carbohydrates Snack before bed of one carbohydrate and one protein choice to see if the morning glucose will decrease. Be sure to count starchy vegetables at carbohydrates (corn, peas, beans)

## 2013-10-30 ENCOUNTER — Other Ambulatory Visit: Payer: Self-pay

## 2013-10-30 DIAGNOSIS — Z1231 Encounter for screening mammogram for malignant neoplasm of breast: Secondary | ICD-10-CM

## 2013-11-09 ENCOUNTER — Ambulatory Visit
Admission: RE | Admit: 2013-11-09 | Discharge: 2013-11-09 | Disposition: A | Discharge status: Home / Self Care | Payer: Medicare Other | Source: Ambulatory Visit

## 2013-11-09 DIAGNOSIS — Z1231 Encounter for screening mammogram for malignant neoplasm of breast: Secondary | ICD-10-CM

## 2013-11-20 ENCOUNTER — Encounter: Payer: Self-pay | Admitting: Podiatry

## 2013-11-20 ENCOUNTER — Ambulatory Visit (INDEPENDENT_AMBULATORY_CARE_PROVIDER_SITE_OTHER): Payer: Medicare Other | Admitting: Podiatry

## 2013-11-20 VITALS — BP 148/82 | HR 83 | Resp 18

## 2013-11-20 DIAGNOSIS — R0989 Other specified symptoms and signs involving the circulatory and respiratory systems: Secondary | ICD-10-CM

## 2013-11-20 DIAGNOSIS — Q828 Other specified congenital malformations of skin: Secondary | ICD-10-CM

## 2013-11-20 DIAGNOSIS — E1159 Type 2 diabetes mellitus with other circulatory complications: Secondary | ICD-10-CM

## 2013-11-20 NOTE — Progress Notes (Signed)
° °  Subjective:    Patient ID: Aimee Kim, female    DOB: Feb 02, 1936, 78 y.o.   MRN: [id number]  HPI my left foot has a callus on the bottom of my foot and sore and tender and I am a diabetic and have been for about 20 years and on the nail on the right big toe gets sore and Dr Amalia Hailey did my right big toenail many years ago and it keeps coming back    Review of Systems  All other systems reviewed and are negative.      Objective:   Physical Exam Orientated x3  female  Vascular: DP pulses 2/4 bilaterally. PT pulses are 0/4 bilaterally. Capillary fill is immediate bilaterally.  Neurological: Sensation to 10 g monofilament wire intact 5/5 bilaterally. Vibratory sensation intact bilaterally. Ankle reflexes reactive bilaterally.  Dermatological: Nucleated keratoses plantar fifth left MPJ noted. The medial margin of the right hallux toenails noted from previous surgery.  Musculoskeletal: HAV deformity left       Assessment & Plan:   Assessment: Suspect performed to disease because of decreased posterior tibial pulses bilaterally Satisfactory neurological status Porokeratoses plantar left  Callused medial nail groove left hallux without any clinical sign of infection  Plan: Patient is referred to vascular lab for lower extremity arterial Doppler for the indication of diminished pedal pulses/diabetes. Porokeratoses plantar left was debrided today. The medial margin left hallux nail was debrided Information about diabetic footcare provided to patient  Contact patient up on receipt of results of arterial Doppler

## 2013-11-20 NOTE — Patient Instructions (Signed)
Our office will schedule an arterial Doppler examination to check your circulation in your feet. The vascular lab will contact you for this examination in our office will notify you of the results.  Diabetes and Foot Care Diabetes may cause you to have problems because of poor blood supply (circulation) to your feet and legs. This may cause the skin on your feet to become thinner, break easier, and heal more slowly. Your skin may become dry, and the skin may peel and crack. You may also have nerve damage in your legs and feet causing decreased feeling in them. You may not notice minor injuries to your feet that could lead to infections or more serious problems. Taking care of your feet is one of the most important things you can do for yourself.  HOME CARE INSTRUCTIONS  Wear shoes at all times, even in the house. Do not go barefoot. Bare feet are easily injured.  Check your feet daily for blisters, cuts, and redness. If you cannot see the bottom of your feet, use a mirror or ask someone for help.  Wash your feet with warm water (do not use hot water) and mild soap. Then pat your feet and the areas between your toes until they are completely dry. Do not soak your feet as this can dry your skin.  Apply a moisturizing lotion or petroleum jelly (that does not contain alcohol and is unscented) to the skin on your feet and to dry, brittle toenails. Do not apply lotion between your toes.  Trim your toenails straight across. Do not dig under them or around the cuticle. File the edges of your nails with an emery board or nail file.  Do not cut corns or calluses or try to remove them with medicine.  Wear clean socks or stockings every day. Make sure they are not too tight. Do not wear knee-high stockings since they may decrease blood flow to your legs.  Wear shoes that fit properly and have enough cushioning. To break in new shoes, wear them for just a few hours a day. This prevents you from injuring  your feet. Always look in your shoes before you put them on to be sure there are no objects inside.  Do not cross your legs. This may decrease the blood flow to your feet.  If you find a minor scrape, cut, or break in the skin on your feet, keep it and the skin around it clean and dry. These areas may be cleansed with mild soap and water. Do not cleanse the area with peroxide, alcohol, or iodine.  When you remove an adhesive bandage, be sure not to damage the skin around it.  If you have a wound, look at it several times a day to make sure it is healing.  Do not use heating pads or hot water bottles. They may burn your skin. If you have lost feeling in your feet or legs, you may not know it is happening until it is too late.  Make sure your health care provider performs a complete foot exam at least annually or more often if you have foot problems. Report any cuts, sores, or bruises to your health care provider immediately. SEEK MEDICAL CARE IF:   You have an injury that is not healing.  You have cuts or breaks in the skin.  You have an ingrown nail.  You notice redness on your legs or feet.  You feel burning or tingling in your legs or feet.  You have pain or cramps in your legs and feet.  Your legs or feet are numb.  Your feet always feel cold. SEEK IMMEDIATE MEDICAL CARE IF:   There is increasing redness, swelling, or pain in or around a wound.  There is a red line that goes up your leg.  Pus is coming from a wound.  You develop a fever or as directed by your health care provider.  You notice a bad smell coming from an ulcer or wound. Document Released: 07/24/2000 Document Revised: 03/29/2013 Document Reviewed: 01/03/2013 [facility] Patient Information 2014 [place].

## 2013-11-27 ENCOUNTER — Ambulatory Visit (HOSPITAL_COMMUNITY)
Admission: RE | Admit: 2013-11-27 | Discharge: 2013-11-27 | Disposition: A | Discharge status: Home / Self Care | Payer: Medicare Other | Source: Ambulatory Visit | Attending: Podiatry | Admitting: Podiatry

## 2013-11-27 DIAGNOSIS — R0989 Other specified symptoms and signs involving the circulatory and respiratory systems: Secondary | ICD-10-CM | POA: Diagnosis not present

## 2013-11-27 DIAGNOSIS — E1159 Type 2 diabetes mellitus with other circulatory complications: Secondary | ICD-10-CM | POA: Insufficient documentation

## 2013-11-27 NOTE — Progress Notes (Signed)
Arterial Duplex Lower Ext. Completed. Woodroe Vogan, BS, RDMS, RVT  

## 2013-11-29 ENCOUNTER — Telehealth: Payer: Self-pay | Admitting: *Deleted

## 2013-11-29 DIAGNOSIS — I739 Peripheral vascular disease, unspecified: Secondary | ICD-10-CM

## 2013-11-29 NOTE — Telephone Encounter (Signed)
Patient stated she received my call.  She stated she's a bit confused because the nurse where she had the doppler told her it was the same as when she had it done 3 years ago.  She said they told her not to let anyone do anything it on her last visit there 3 years ago.  She requested a hard copy of the doppler so she can take it to her primary care doctor.  She's going to come by to pick it up.

## 2013-11-29 NOTE — Telephone Encounter (Signed)
I called and left the patient a message that her doppler results were abnormal.  Dr. Amalia Hailey wants her to follow-up with Dr. Gwenlyn Found at [facility].

## 2013-11-29 NOTE — Telephone Encounter (Signed)
Message copied by Lolita Rieger on Wed Nov 29, 2013  3:25 PM ------      Message from: Gean Birchwood      Created: Wed Nov 29, 2013  8:13 AM       This is an abnormal arterial Doppler of 10/28/2013. Please contact patient and Dr. Alvester Chou for a referral to evaluate this abnormal vascular examination. ------

## 2013-11-30 ENCOUNTER — Telehealth (HOSPITAL_COMMUNITY): Payer: Self-pay | Admitting: *Deleted

## 2015-01-01 ENCOUNTER — Telehealth: Payer: Self-pay | Admitting: Oncology

## 2015-01-01 NOTE — Telephone Encounter (Signed)
01/01/15 - Called patient's home number and it had been disconnected or no longer in service. Called mobile number and no voice mail and no answer.  Tried son's number with no answer. Trying to close out chart with letter mailed to patient with which medicine she received while on study. Remer Macho 01/01/15 - 1:45 pm

## 2018-02-07 DEATH — deceased
